# Patient Record
Sex: Male | Born: 2000 | Race: Black or African American | Hispanic: No | Marital: Single | Smoking: Never smoker
Health system: Southern US, Community
[De-identification: ages and names within clinical notes are randomized; demographics above are authoritative.]

## PROBLEM LIST (undated history)

## (undated) DIAGNOSIS — K589 Irritable bowel syndrome without diarrhea: Secondary | ICD-10-CM

---
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
## 2022-03-31 ENCOUNTER — Other Ambulatory Visit: Payer: Self-pay

## 2022-03-31 ENCOUNTER — Emergency Department
Admission: EM | Admit: 2022-03-31 | Discharge: 2022-03-31 | Disposition: A | Discharge status: Home / Self Care | Payer: BC Managed Care – PPO | Attending: Emergency Medicine | Admitting: Emergency Medicine

## 2022-03-31 ENCOUNTER — Emergency Department: Payer: BC Managed Care – PPO

## 2022-03-31 ENCOUNTER — Encounter: Payer: Self-pay | Admitting: Emergency Medicine

## 2022-03-31 DIAGNOSIS — J069 Acute upper respiratory infection, unspecified: Secondary | ICD-10-CM | POA: Insufficient documentation

## 2022-03-31 DIAGNOSIS — J029 Acute pharyngitis, unspecified: Secondary | ICD-10-CM | POA: Diagnosis present

## 2022-03-31 DIAGNOSIS — Z20822 Contact with and (suspected) exposure to covid-19: Secondary | ICD-10-CM | POA: Diagnosis not present

## 2022-03-31 LAB — RESP PANEL BY RT-PCR (FLU A&B, COVID) ARPGX2
Influenza A by PCR: NEGATIVE
Influenza B by PCR: NEGATIVE
SARS Coronavirus 2 by RT PCR: NEGATIVE

## 2022-03-31 LAB — GROUP A STREP BY PCR: Group A Strep by PCR: NOT DETECTED

## 2022-03-31 MED ORDER — ONDANSETRON 4 MG PO TBDP: 4.0000 mg | ORAL_TABLET | Freq: Four times a day (QID) | ORAL | 0 refills | Status: AC | PRN

## 2022-03-31 MED ORDER — IBUPROFEN 800 MG PO TABS
800.0000 mg | ORAL_TABLET | Freq: Once | ORAL | Status: AC
  Administered 2022-03-31: 800 mg via ORAL
  Filled 2022-03-31: qty 1

## 2022-03-31 MED ORDER — ONDANSETRON 4 MG PO TBDP
4.0000 mg | ORAL_TABLET | Freq: Once | ORAL | Status: AC
  Administered 2022-03-31: 4 mg via ORAL
  Filled 2022-03-31: qty 1

## 2022-03-31 NOTE — ED Triage Notes (Signed)
Patient ambulatory to triage with steady gait, without difficulty or distress noted; pt reports sore throat, runny nose and feeling "weak" since yesterday ?

## 2022-03-31 NOTE — Discharge Instructions (Signed)
You may alternate Tylenol 1000 mg every 6 hours as needed for pain, fever and Ibuprofen 800 mg every 6-8 hours as needed for pain, fever.  Please take Ibuprofen with food.  Do not take more than 4000 mg of Tylenol (acetaminophen) in a 24 hour period. ? ?You may use over-the-counter nasal saline and Afrin nasal spray to help with nasal drainage. ? ?You may use over-the-counter Robitussin as needed for cough. ? ?Your COVID, flu, strep swabs are negative.  Your chest x-ray was clear and showed no pneumonia.  You do not need antibiotics today. ? ? ?Steps to find a Primary Care Provider (PCP): ? ?Call [phone] or [phone] to access "[facility]." ? ?2.  You may also go on the [facility] website at [email or web address] ? ?

## 2022-03-31 NOTE — ED Provider Notes (Signed)
? ?[facility] ?Provider Note ? ? ? Event Date/Time  ? First MD Initiated Contact with Patient 03/31/22 0149   ?  (approximate) ? ? ?History  ? ?Sore Throat ? ? ?HPI ? ?Timothy Campos. is a 21 y.o. male with history of IBS who presents to the emergency department complaints of feeling like "I have COVID symptoms".  States that for the past day he has had subjective fevers, chills, runny nose, dry cough, sore throat and one episode of nonbloody, nonbilious vomiting.  No diarrhea.  No chest pain, shortness of breath, abdominal pain.  No sick contacts or recent travel. ? ? ?History provided by patient and father. ? ? ? ?Past Medical History:  ?Diagnosis Date  ? IBS (irritable bowel syndrome)   ? ? ?History reviewed. No pertinent surgical history. ? ?MEDICATIONS:  ?Prior to Admission medications   ?Medication Sig Start Date End Date Taking? Authorizing Provider  ?hyoscyamine (LEVSIN SL) 0.125 MG SL tablet Place under the tongue. 02/20/17 05/21/17  [provider]  ?naproxen (NAPROSYN) 375 MG tablet Take 1 tablet (375 mg total) by mouth 2 (two) times daily as needed for moderate pain. 12/09/20   Tommie Sams, DO  ?albuterol (PROVENTIL HFA;VENTOLIN HFA) 108 (90 Base) MCG/ACT inhaler Inhale 2 puffs into the lungs every 4 (four) hours as needed for wheezing or shortness of breath (prior to exercise). ?Patient not taking: No sig reported 12/23/16 12/09/20  Anola Gurney, PA  ?diphenhydrAMINE (BENADRYL) 25 mg capsule Take by mouth.  12/09/20  [provider]  ?fluticasone (FLONASE) 50 MCG/ACT nasal spray Place 2 sprays into both nostrils daily. 09/01/15 12/09/20  Anola Gurney, PA  ? ? ?Physical Exam  ? ?Triage Vital Signs: ?ED Triage Vitals  ?Enc Vitals Group  ?   BP 03/31/22 0152 120/70  ?   Pulse Rate 03/31/22 0152 71  ?   Resp 03/31/22 0152 17  ?   Temp 03/31/22 0152 99.7 ?F (37.6 ?C)  ?   Temp Source 03/31/22 0152 Oral  ?   SpO2 03/31/22 0152 97 %  ?   Weight 03/31/22 0145  190 lb (86.2 kg)  ?   Height 03/31/22 0145 6\' 6"  (1.981 m)  ?   Head Circumference --   ?   Peak Flow --   ?   Pain Score 03/31/22 0146 7  ?   Pain Loc --   ?   Pain Edu? --   ?   Excl. in GC? --   ? ? ?Most recent vital signs: ?Vitals:  ? 03/31/22 0152  ?BP: 120/70  ?Pulse: 71  ?Resp: 17  ?Temp: 99.7 ?F (37.6 ?C)  ?SpO2: 97%  ? ? ?CONSTITUTIONAL: Alert and oriented and responds appropriately to questions. Well-appearing; well-nourished, nontoxic ?HEAD: Normocephalic, atraumatic ?EYES: Conjunctivae clear, pupils appear equal, sclera nonicteric ?ENT: normal nose; moist mucous membranes, small amount of clear rhinorrhea on exam; No pharyngeal erythema or petechiae, no tonsillar hypertrophy or exudate, no uvular deviation, no unilateral swelling in posterior oropharynx, no trismus or drooling, no muffled voice, normal phonation, no stridor, airway patent. ?NECK: Supple, normal ROM ?CARD: RRR; S1 and S2 appreciated; no murmurs, no clicks, no rubs, no gallops ?RESP: Normal chest excursion without splinting or tachypnea; breath sounds clear and equal bilaterally; no wheezes, no rhonchi, no rales, no hypoxia or respiratory distress, speaking full sentences ?ABD/GI: Normal bowel sounds; non-distended; soft, non-tender, no rebound, no guarding, no peritoneal signs ?BACK: The back appears normal ?EXT: Normal ROM  in all joints; no deformity noted, no edema; no cyanosis ?SKIN: Normal color for age and race; warm; no rash on exposed skin ?NEURO: Moves all extremities equally, normal speech ?PSYCH: The patient's mood and manner are appropriate. ? ? ?ED Results / Procedures / Treatments  ? ?LABS: ?(all labs ordered are listed, but only abnormal results are displayed) ?Labs Reviewed  ?RESP PANEL BY RT-PCR (FLU A&B, COVID) [id number]  ?GROUP A STREP BY PCR  ? ? ? ?EKG: ? ? ?RADIOLOGY: ?My personal review and interpretation of imaging: Chest x-ray shows no pneumonia. ? ?I have personally reviewed all radiology reports.   ?DG Chest 2  View ? ?Result Date: 03/31/2022 ?CLINICAL DATA:  Cough EXAM: CHEST - 2 VIEW COMPARISON:  04/06/2021 FINDINGS: The heart size and mediastinal contours are within normal limits. Both lungs are clear. The visualized skeletal structures are unremarkable. IMPRESSION: Normal study. Electronically Signed   By: Charlett Nose M.D.   On: 03/31/2022 03:10   ? ? ?PROCEDURES: ? ?Critical Care performed: No ? ? ?CRITICAL CARE ?Performed by: Baxter Hire Vidhi Delellis ? ? ?Total critical care time: 0 minutes ? ?Critical care time was exclusive of separately billable procedures and treating other patients. ? ?Critical care was necessary to treat or prevent imminent or life-threatening deterioration. ? ?Critical care was time spent personally by me on the following activities: development of treatment plan with patient and/or surrogate as well as nursing, discussions with consultants, evaluation of patient's response to treatment, examination of patient, obtaining history from patient or surrogate, ordering and performing treatments and interventions, ordering and review of laboratory studies, ordering and review of radiographic studies, pulse oximetry and re-evaluation of patient's condition. ? ? ?Procedures ? ? ? ?IMPRESSION / MDM / ASSESSMENT AND PLAN / ED COURSE  ?I reviewed the triage vital signs and the nursing notes. ? ? ? ?Patient here with subjective fevers, congestion, cough, vomiting. ? ?The patient is on the cardiac monitor to evaluate for evidence of arrhythmia and/or significant heart rate changes. ? ? ?DIFFERENTIAL DIAGNOSIS (includes but not limited to):   Viral illness, flu, COVID, strep pharyngitis, pneumonia, doubt mono, appendicitis, UTI ? ? ?PLAN: We will obtain COVID, flu, strep swabs.  Will obtain chest x-ray.  Will give ibuprofen and Zofran for symptomatic relief. ? ? ?MEDICATIONS GIVEN IN ED: ?Medications  ?ibuprofen (ADVIL) tablet 800 mg (800 mg Oral Given 03/31/22 0240)  ?ondansetron (ZOFRAN-ODT) disintegrating tablet 4 mg  (4 mg Oral Given 03/31/22 0240)  ? ? ? ?ED COURSE: Patient's COVID, flu, strep swabs are negative.  Chest x-ray reviewed by myself and radiologist and shows no infiltrate, edema or pneumothorax.  Patient feeling better and tolerating p.o. ? ? ?CONSULTS: No admission needed at this time.  Patient well-appearing, nontoxic without signs of sepsis, hypoxia or increased work of breathing. ? ? ?OUTSIDE RECORDS REVIEWED: Reviewed patient's last office visit with Bevelyn Buckles on 02/17/2022. ? ? ? ? ? ? ? ? ?FINAL CLINICAL IMPRESSION(S) / ED DIAGNOSES  ? ?Final diagnoses:  ?Viral upper respiratory tract infection  ? ? ? ?Rx / DC Orders  ? ?ED Discharge Orders   ? ?      Ordered  ?  ondansetron (ZOFRAN-ODT) 4 MG disintegrating tablet  Every 6 hours PRN       ? 03/31/22 0309  ? ?  ?  ? ?  ? ? ? ?Note:  This document was prepared using Dragon voice recognition software and may include unintentional dictation errors. ?  ?Eddy Termine, Layla Maw,  DO ?03/31/22 0316 ? ?

## 2022-07-12 IMAGING — CR DG CHEST 2V
1 series · 2 of 2 positions shown · non-contrast
Comparison: None.

CLINICAL DATA: 19-year-old male with cough and fever. Cough and
cold symptoms for a few days.

EXAM:
CHEST - 2 VIEW

[Series 1: dg chest 2 view · 0.14mm/px · 2 of 2 slices shown]
[im 1/2]
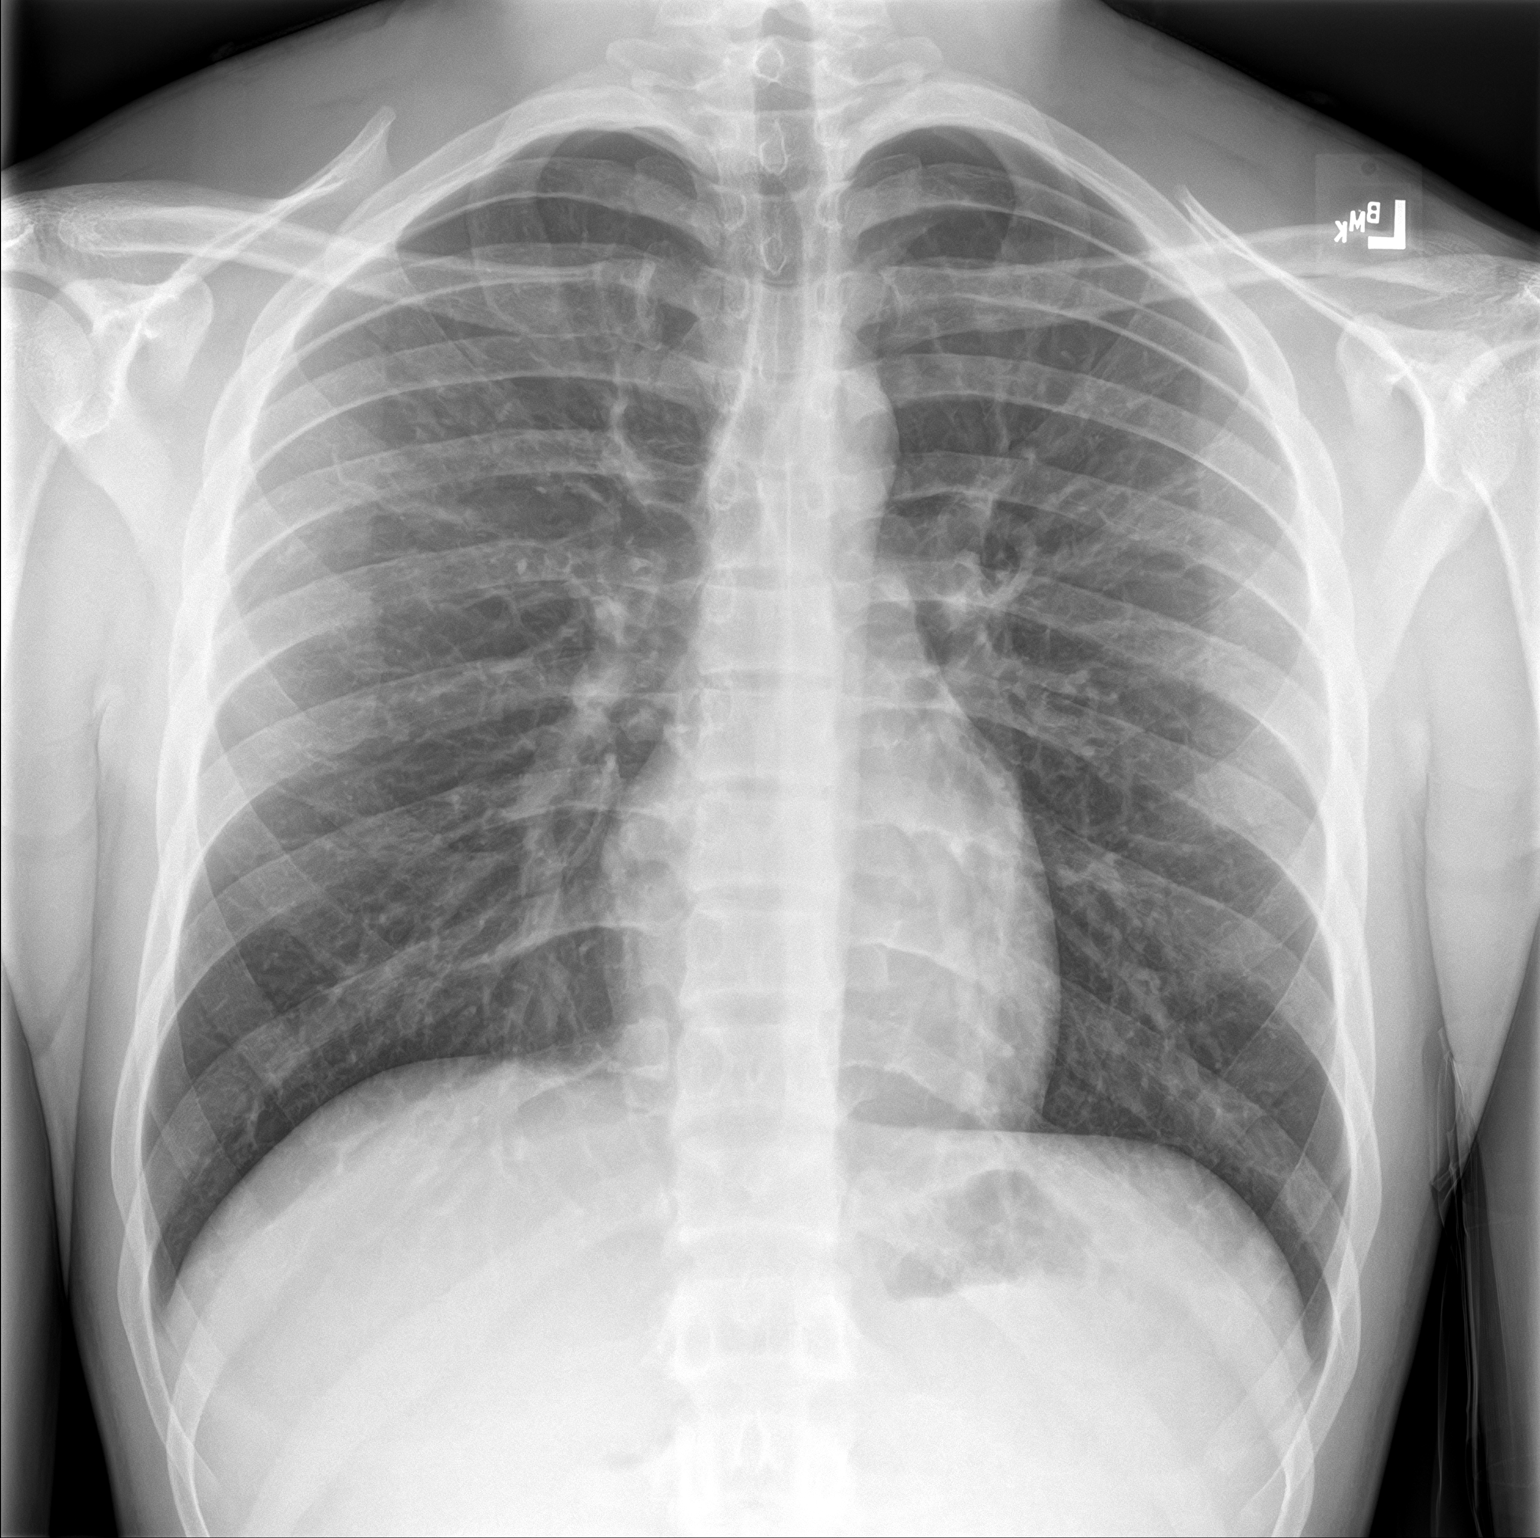
[im 2/2]
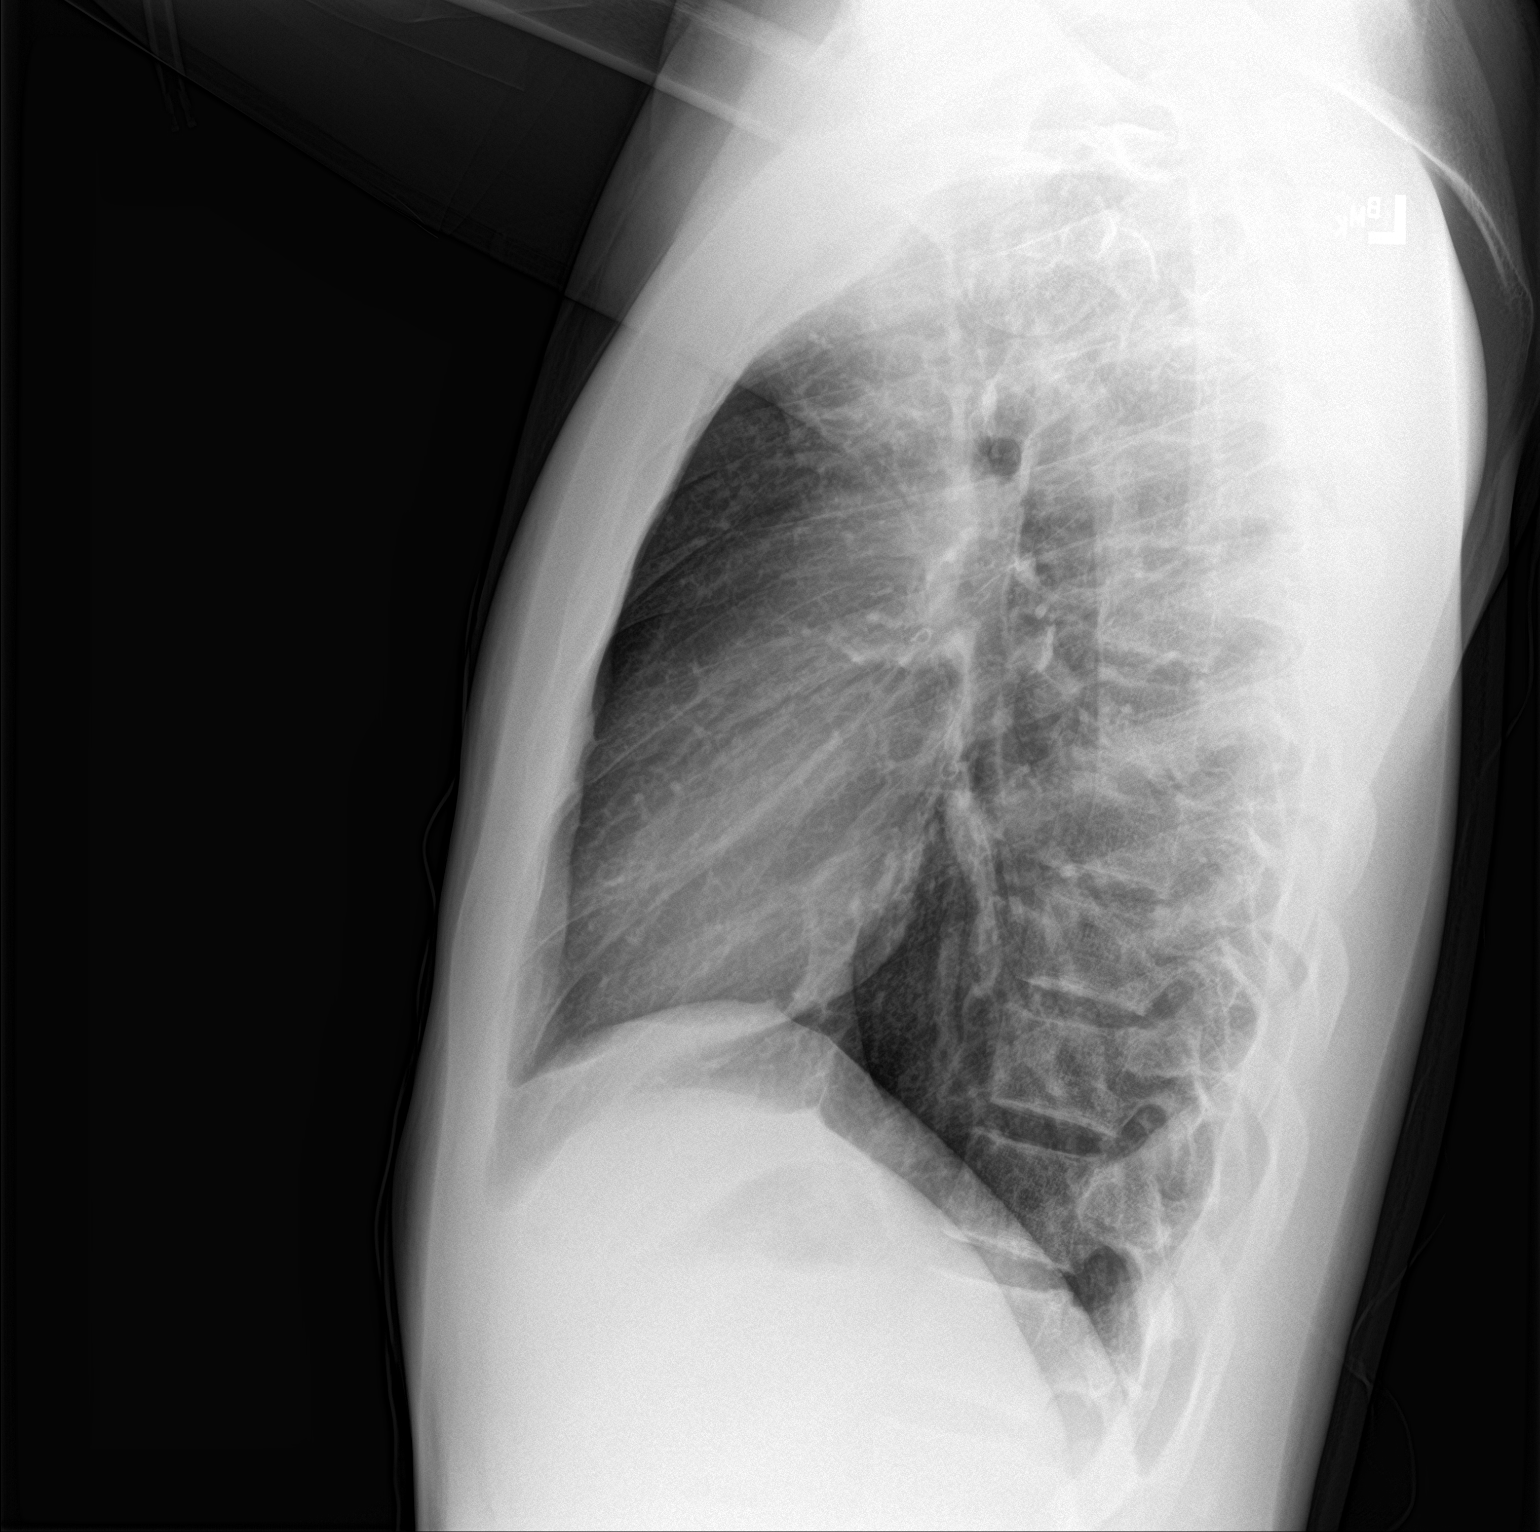

[2 of 2 positions shown; findings below may reference images not displayed]

FINDINGS: Normal lung volumes and mediastinal contours. Visualized tracheal
air column is within normal limits. No pneumothorax, pulmonary
edema, pleural effusion or confluent opacity. However, there is
mildly increased asymmetric interstitial opacity in the upper lungs.

No osseous abnormality identified. Paucity of bowel gas in the upper
abdomen.
IMPRESSION: Negative aside from increased mild asymmetric interstitial opacity
in the upper lungs. Consider viral/atypical respiratory infection.

## 2023-07-06 IMAGING — CR DG CHEST 2V
2 series · 2 of 2 positions shown · non-contrast
Comparison: 04/06/2021

CLINICAL DATA: Cough

EXAM:
CHEST - 2 VIEW

[chest pa]
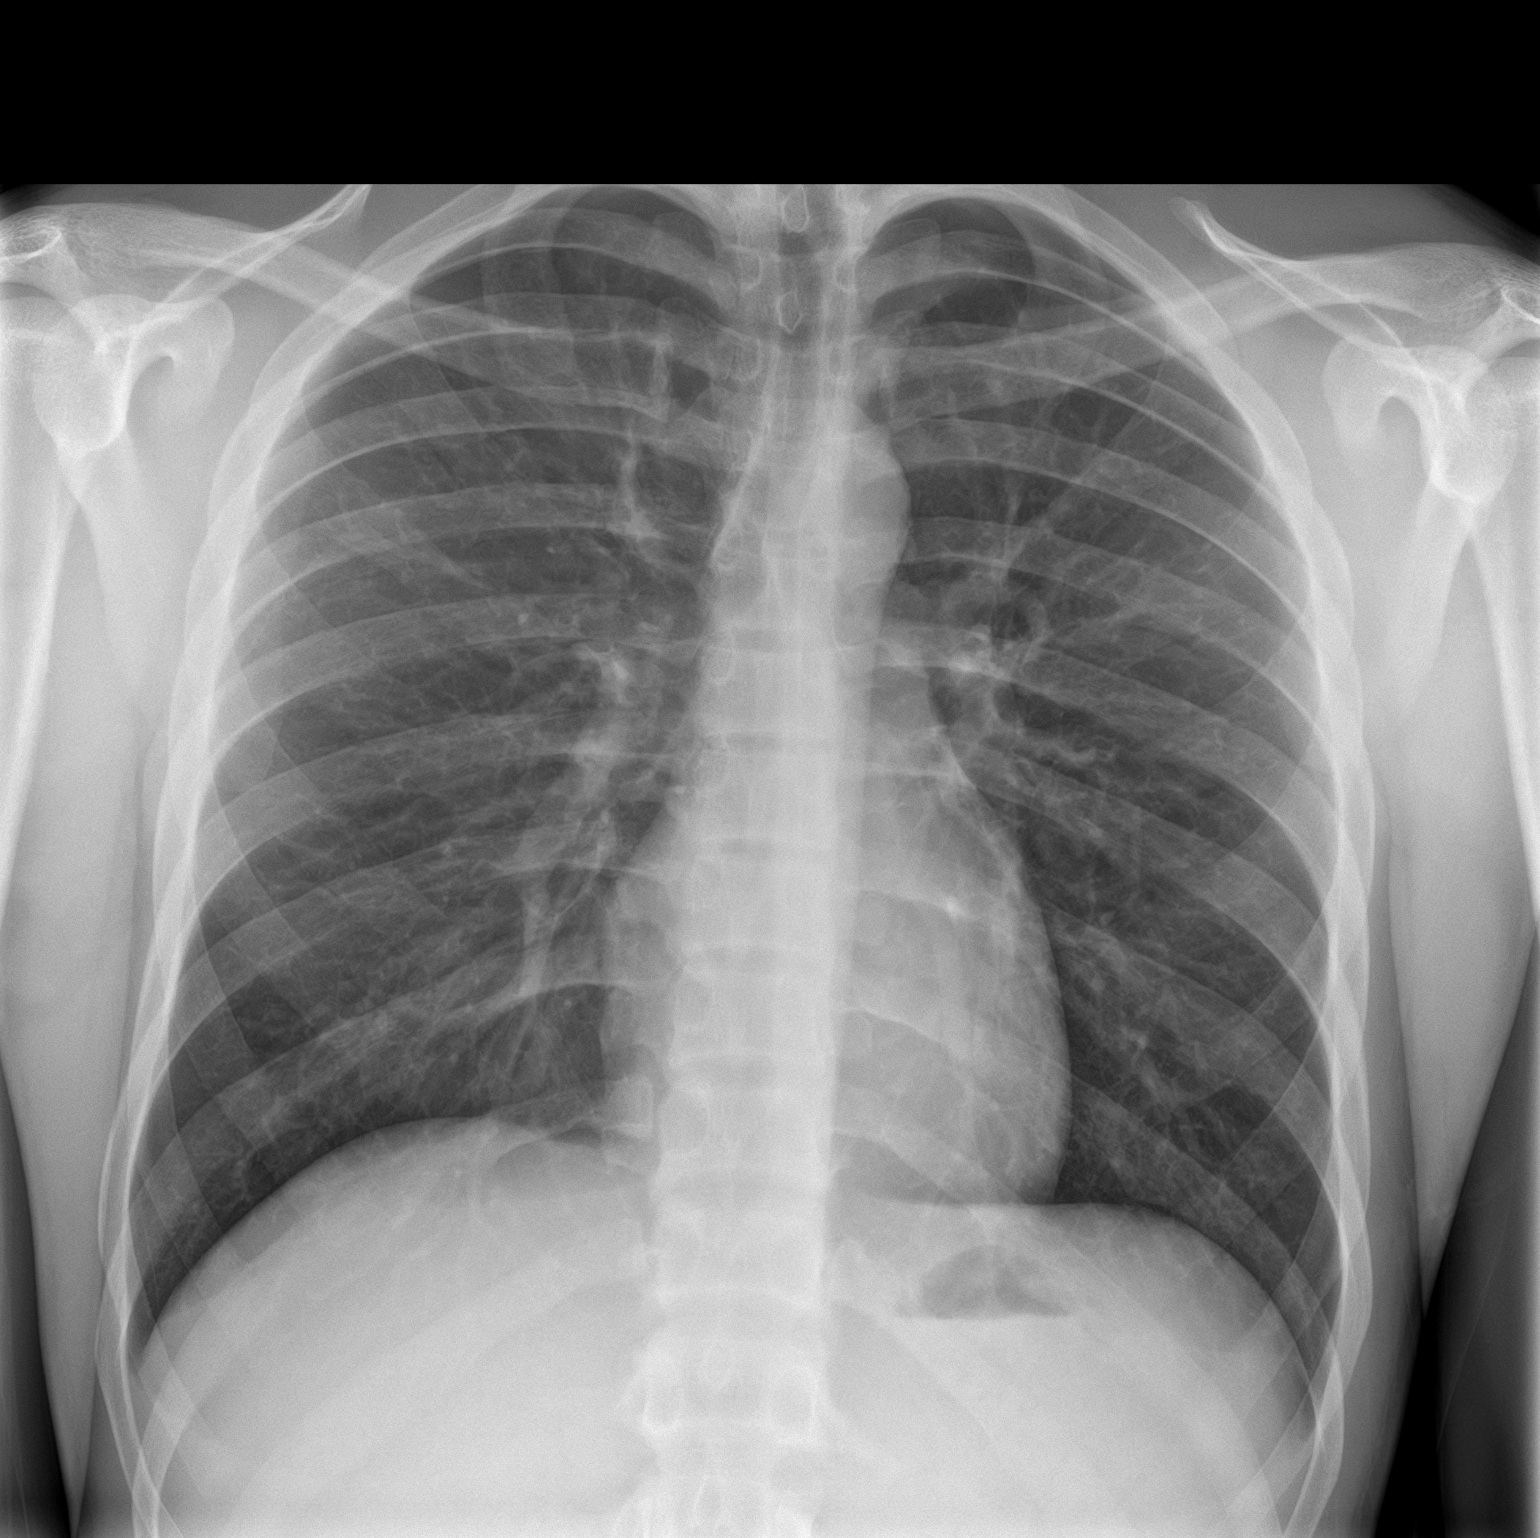

[chest lat]
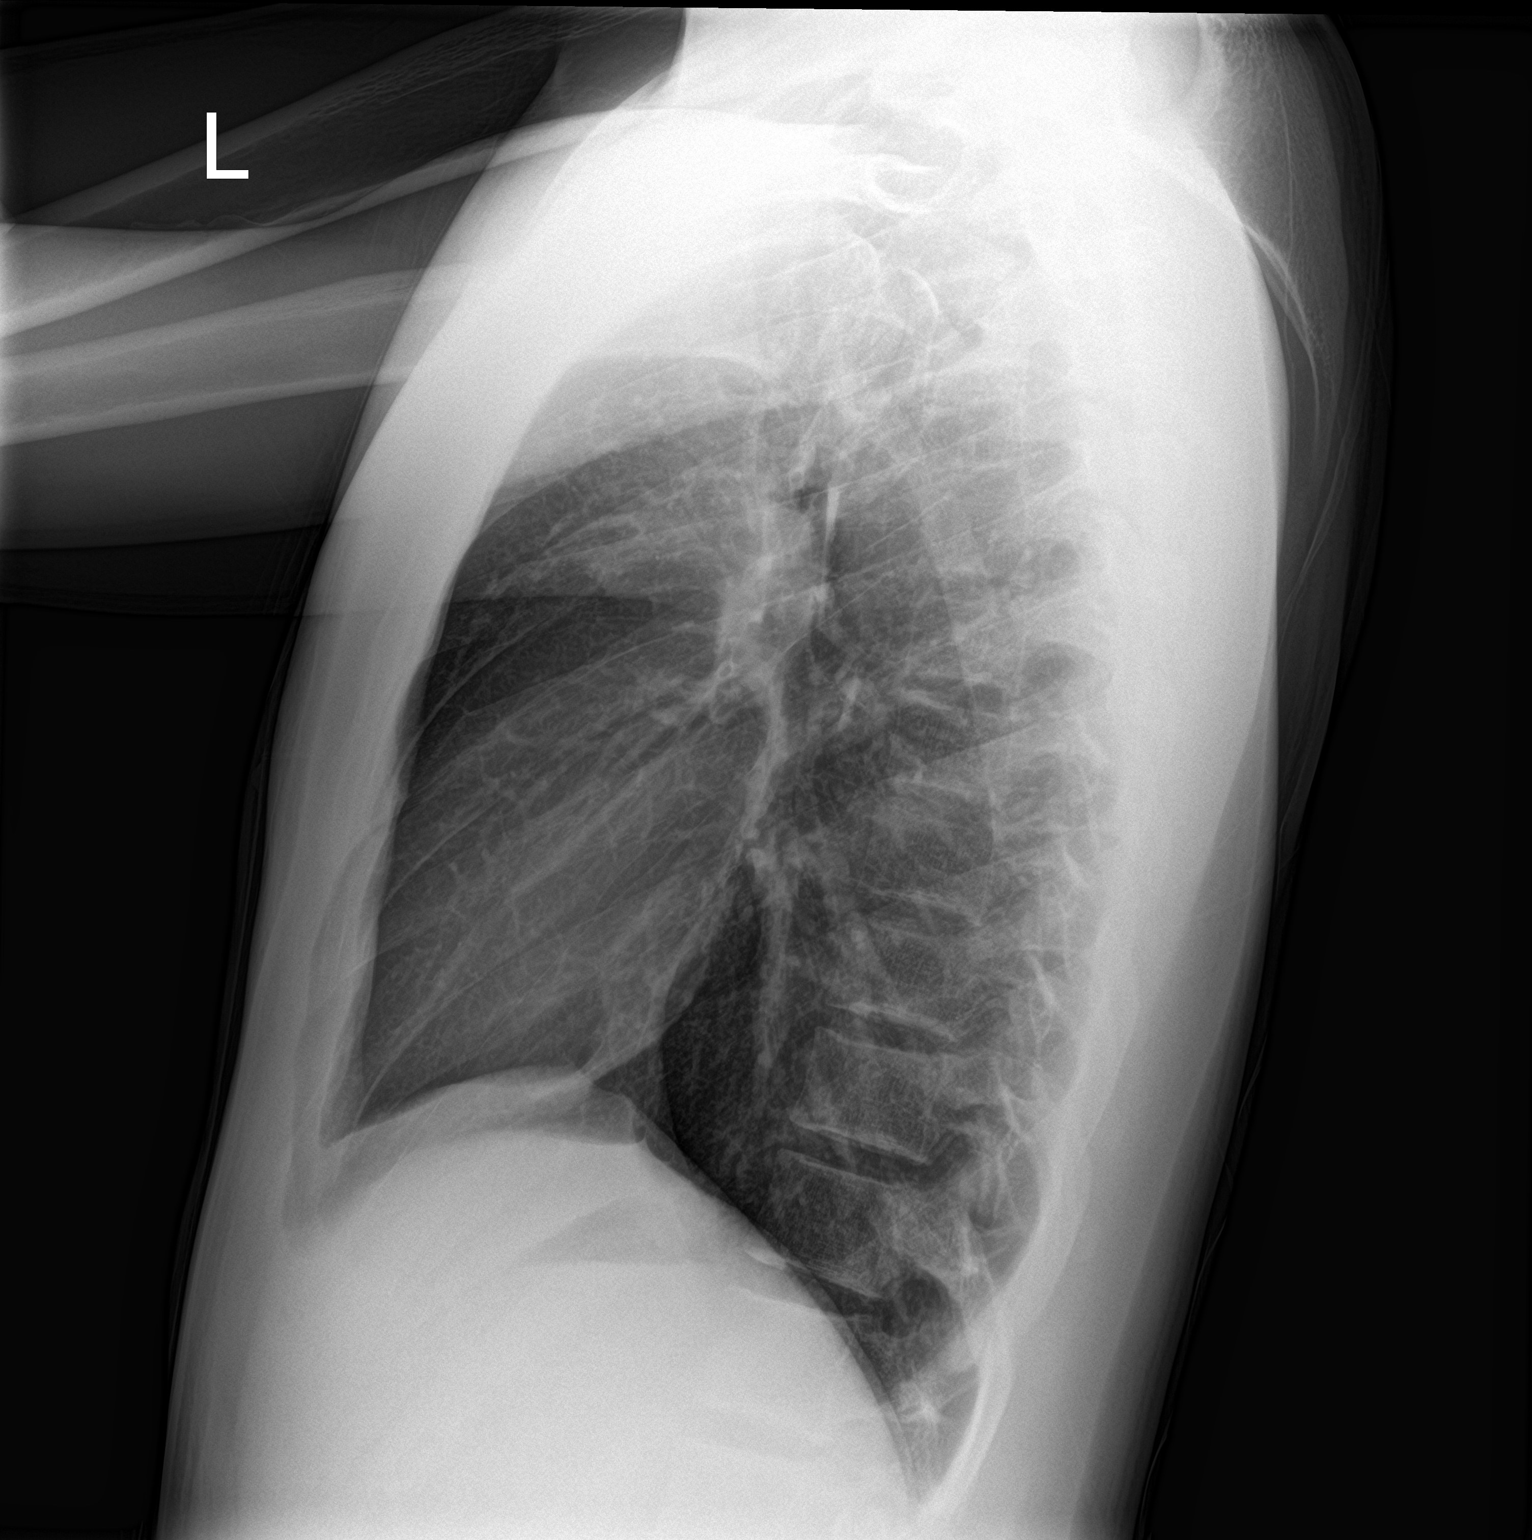

[2 of 2 positions shown; findings below may reference images not displayed]

FINDINGS: The heart size and mediastinal contours are within normal limits.
Both lungs are clear. The visualized skeletal structures are
unremarkable.
IMPRESSION: Normal study.

## 2023-07-11 DIAGNOSIS — Z5321 Procedure and treatment not carried out due to patient leaving prior to being seen by health care provider: Secondary | ICD-10-CM | POA: Insufficient documentation

## 2023-07-11 DIAGNOSIS — S31104A Unspecified open wound of abdominal wall, left lower quadrant without penetration into peritoneal cavity, initial encounter: Secondary | ICD-10-CM | POA: Insufficient documentation

## 2023-07-11 DIAGNOSIS — W3400XA Accidental discharge from unspecified firearms or gun, initial encounter: Secondary | ICD-10-CM | POA: Diagnosis not present

## 2023-07-12 ENCOUNTER — Encounter: Payer: Self-pay | Admitting: Emergency Medicine

## 2023-07-12 ENCOUNTER — Emergency Department
Admission: EM | Admit: 2023-07-12 | Discharge: 2023-07-12 | Discharge status: Against Medical Advice | Payer: BC Managed Care – PPO | Attending: Emergency Medicine | Admitting: Emergency Medicine

## 2023-07-12 ENCOUNTER — Other Ambulatory Visit: Payer: Self-pay

## 2023-07-12 NOTE — ED Notes (Signed)
Called [facility] c-com to report pts arrival
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
